# Patient Record
Sex: Male | Born: 1970 | Race: Black or African American | Hispanic: No | Marital: Married
Health system: Southern US, Community
[De-identification: ages and names within clinical notes are randomized; demographics above are authoritative.]

---

## 2005-10-09 ENCOUNTER — Emergency Department: Payer: Self-pay | Admitting: Emergency Medicine

## 2009-11-14 ENCOUNTER — Emergency Department: Payer: Self-pay | Admitting: Emergency Medicine

## 2009-11-22 ENCOUNTER — Emergency Department: Payer: Self-pay | Admitting: Internal Medicine

## 2010-09-08 ENCOUNTER — Emergency Department: Payer: Self-pay | Admitting: Emergency Medicine
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
## 2013-02-05 IMAGING — US ABDOMEN ULTRASOUND LIMITED
1 series · 17 of 25 positions shown · non-contrast
Comparison: none

REASON FOR EXAM: epigastric pain/fullness
COMMENTS:

[Series 1: abdomen ultrasound limited · 17 of 67 slices shown]
[im 1/67]
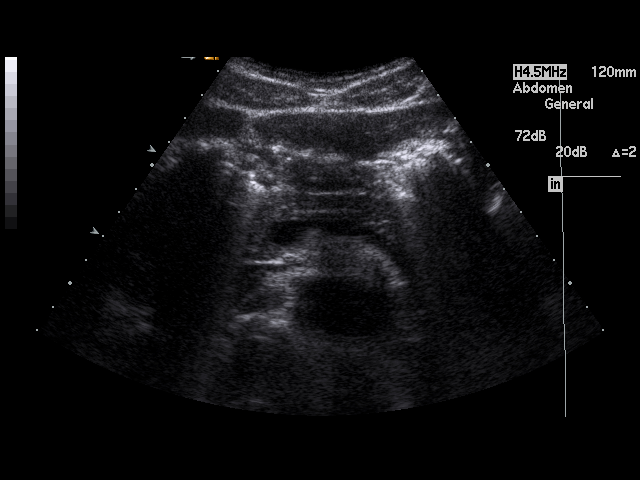
[im 6/67]
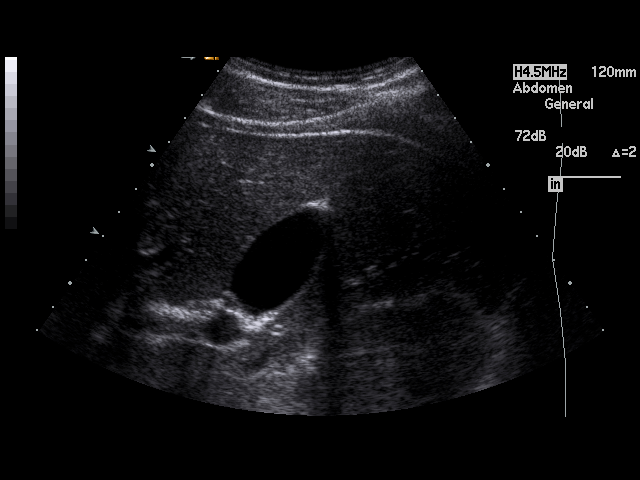
[im 9/67]
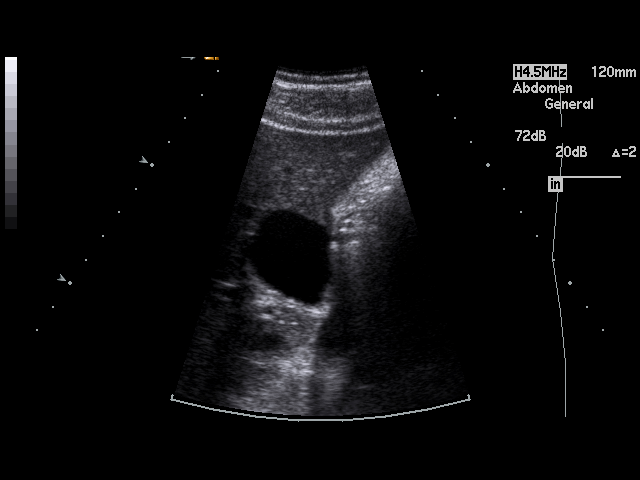
[im 14/67]
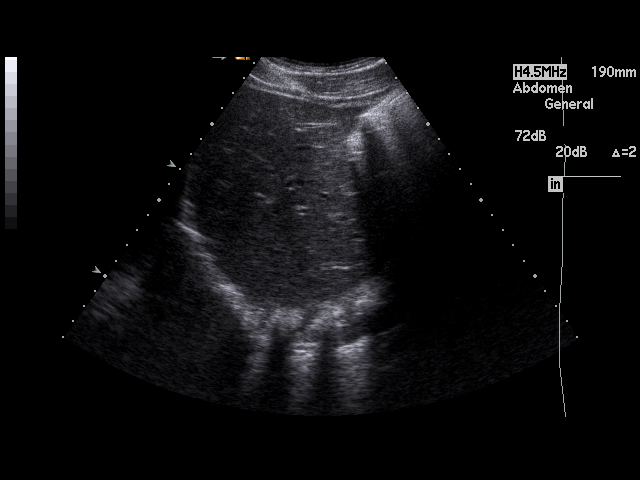
[im 17/67]
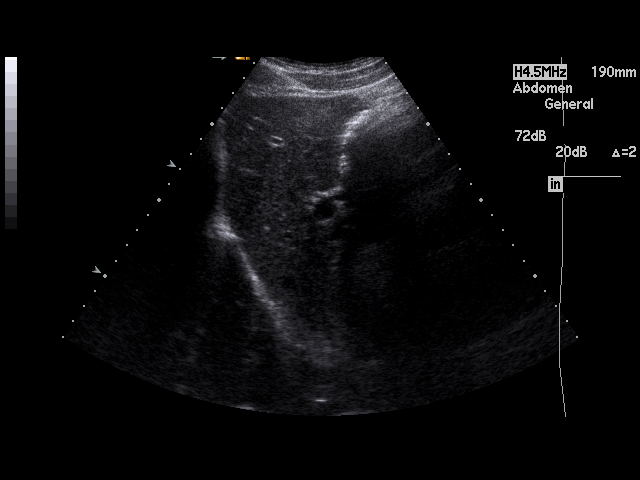
[im 23/67]
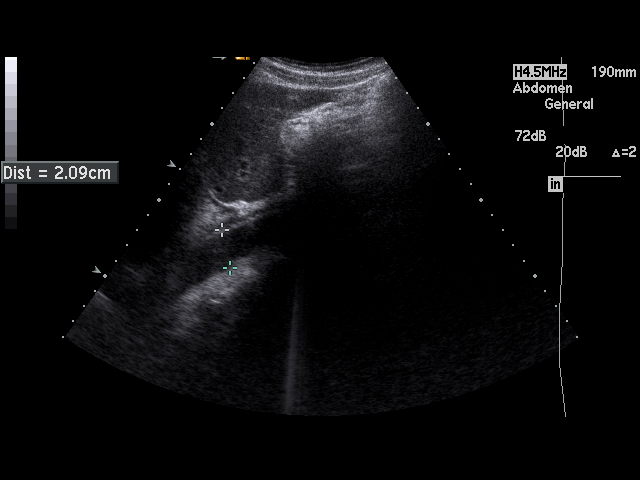
[im 25/67]
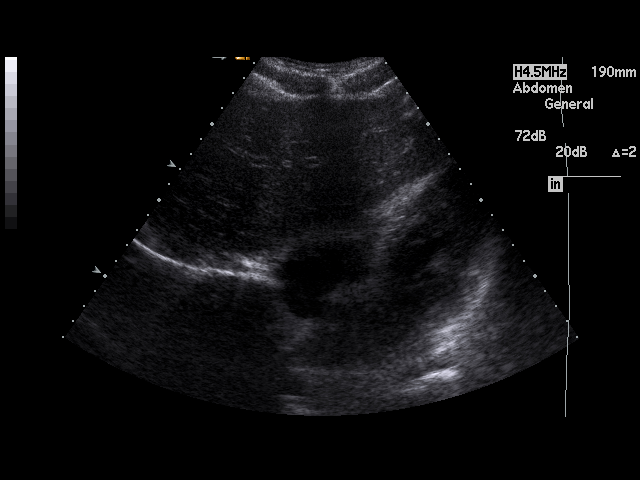
[im 31/67]
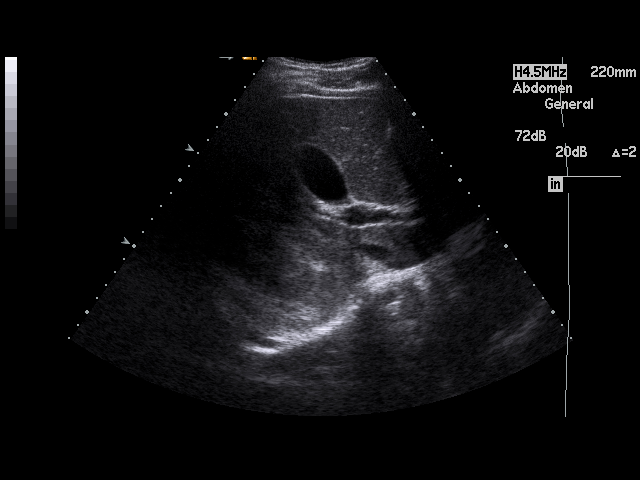
[im 34/67]
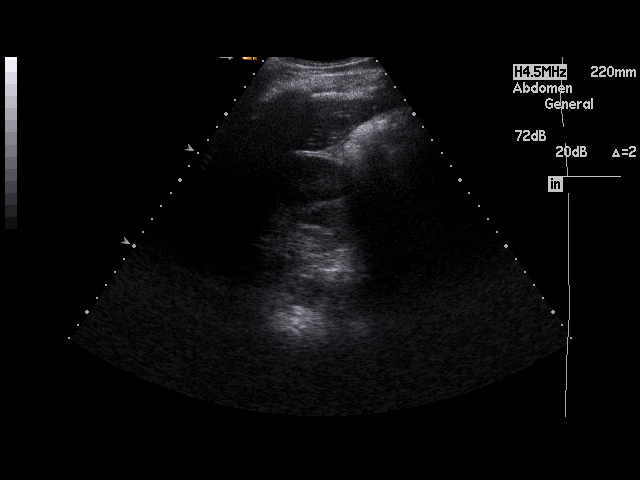
[im 36/67]
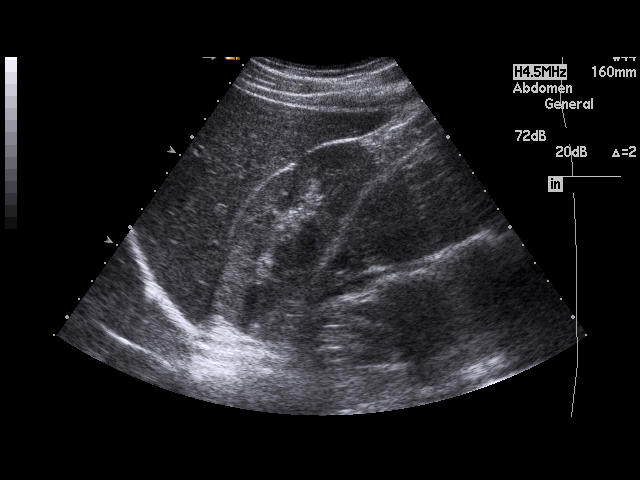
[im 42/67]
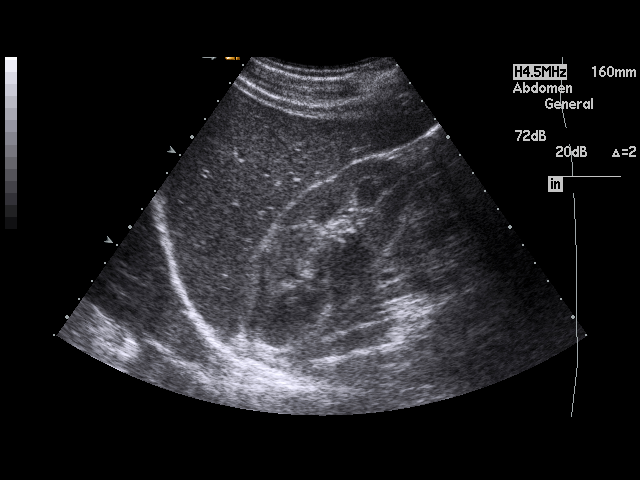
[im 45/67]
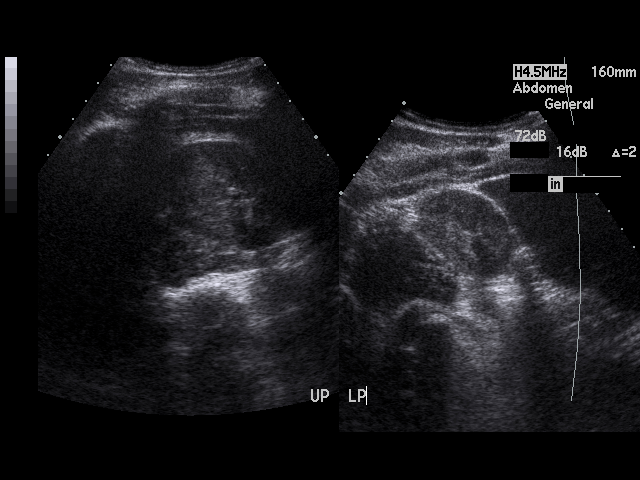
[im 50/67]
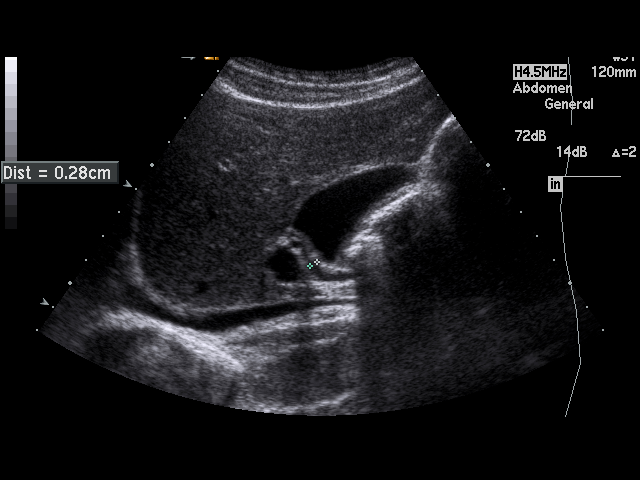
[im 53/67]
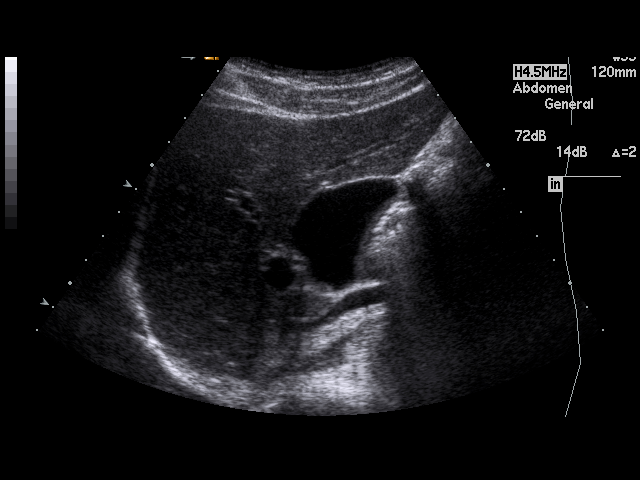
[im 58/67]
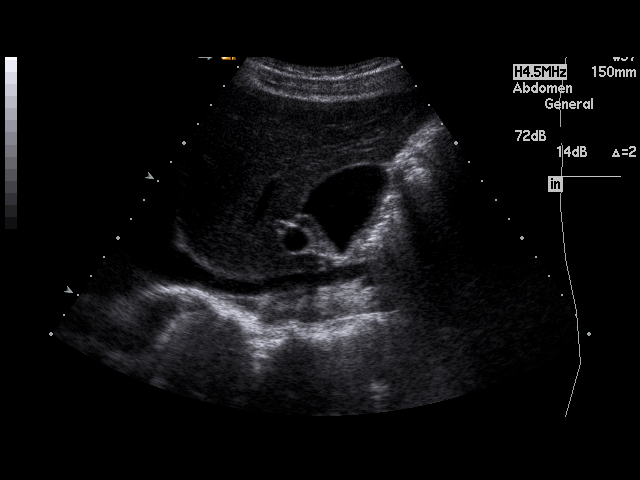
[im 61/67]
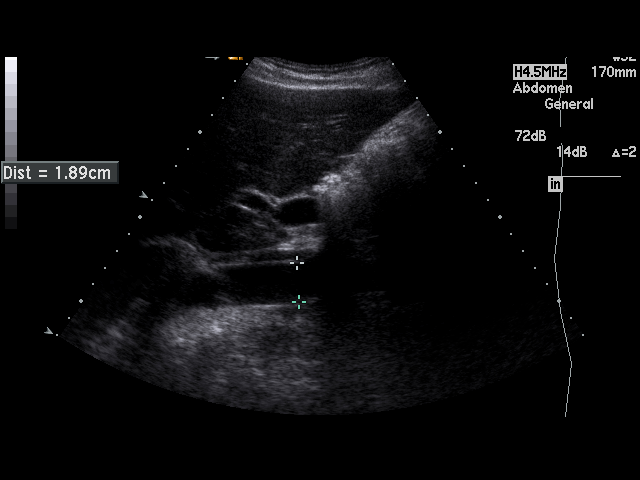
[im 67/67]
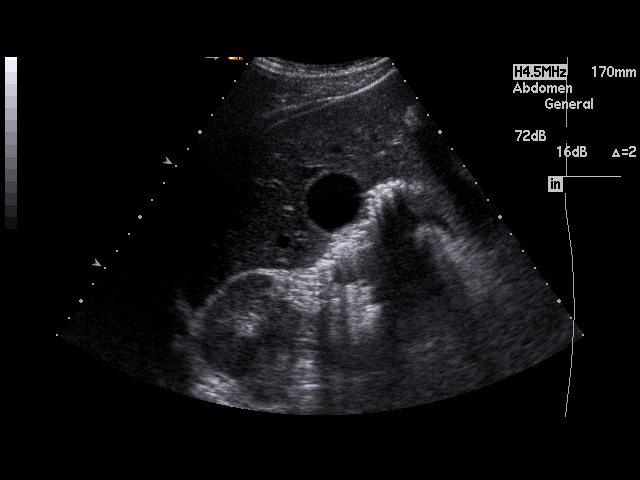

[17 of 25 positions shown; findings below may reference images not displayed]

PROCEDURE:     US  - US ABDOMEN LIMITED SURVEY  - September 08, 2010  [DATE]

RESULT:     The liver, pancreas, abdominal aorta and inferior vena cava show
no significant abnormalities. No gallstones are seen. There is no thickening
of the gallbladder wall. The common bile duct measures 4 mm in diameter
which is within normal limits. The right kidney is visualized and shows no
hydronephrosis.
IMPRESSION: Limited abdominal ultrasound examination was performed. No gallstones or
other acute change is identified.

## 2019-03-21 ENCOUNTER — Other Ambulatory Visit: Payer: Self-pay

## 2019-03-21 DIAGNOSIS — K624 Stenosis of anus and rectum: Secondary | ICD-10-CM

## 2019-03-22 LAB — NOVEL CORONAVIRUS, NAA: SARS-CoV-2, NAA: NOT DETECTED

## 2019-05-30 ENCOUNTER — Other Ambulatory Visit: Payer: Self-pay

## 2019-05-30 DIAGNOSIS — Z20822 Contact with and (suspected) exposure to covid-19: Secondary | ICD-10-CM

## 2019-06-01 ENCOUNTER — Telehealth: Payer: Self-pay

## 2019-06-01 LAB — NOVEL CORONAVIRUS, NAA: SARS-CoV-2, NAA: NOT DETECTED

## 2019-06-01 NOTE — Telephone Encounter (Signed)
Pt called for test results, and advised pt that results are not back. MyChart signup link sent via text.

## 2022-03-16 ENCOUNTER — Encounter: Payer: Self-pay | Admitting: Podiatry

## 2022-03-16 ENCOUNTER — Ambulatory Visit (INDEPENDENT_AMBULATORY_CARE_PROVIDER_SITE_OTHER): Payer: Self-pay | Admitting: Podiatry

## 2022-03-16 DIAGNOSIS — L600 Ingrowing nail: Secondary | ICD-10-CM

## 2022-03-16 MED ORDER — NEOMYCIN-POLYMYXIN-HC 1 % OT SOLN: OTIC | 0 refills | Status: AC

## 2022-03-16 NOTE — Patient Instructions (Signed)

## 2022-03-18 NOTE — Progress Notes (Signed)
  Subjective:  Patient ID: Eric James, male    DOB: Jan 09, 1971,  MRN: [id number]  Chief Complaint  Patient presents with   Ingrown Toenail     NP - R great toe ingrown, lateral border- selfpay     51 y.o. male presents with the above complaint. History confirmed with patient.  Has been present for some time its been quite painful for him.  He has had the left side before and had it treated with matricectomy which was successful  Objective:  Physical Exam: warm, good capillary refill, no trophic changes or ulcerative lesions, normal DP and PT pulses, normal sensory exam, and ingrowing right hallux lateral border.  Assessment:   1. Ingrown nail of great toe of right foot      Plan:  Patient was evaluated and treated and all questions answered.    Ingrown Nail, left -Patient elects to proceed with minor surgery to remove ingrown toenail today. Consent reviewed and signed by patient. -Ingrown nail excised. See procedure note. -Educated on post-procedure care including soaking. Written instructions provided and reviewed. -Patient to follow up in 2 weeks for nail check.  Procedure: Excision of Ingrown Toenail Location: Left 1st toe lateral nail borders. Anesthesia: Lidocaine 1% plain; 1.5 mL and Marcaine 0.5% plain; 1.5 mL, digital block. Skin Prep: Betadine. Dressing: Silvadene; telfa; dry, sterile, compression dressing. Technique: Following skin prep, the toe was exsanguinated and a tourniquet was secured at the base of the toe. The affected nail border was freed, split with a nail splitter, and excised. Chemical matrixectomy was then performed with phenol and irrigated out with alcohol. The tourniquet was then removed and sterile dressing applied. Disposition: Patient tolerated procedure well. Patient to return in 2 weeks for follow-up.   Return if symptoms worsen or fail to improve.
